# Patient Record
Sex: Male | Born: 1970 | Race: White | Hispanic: No | Marital: Single | Smoking: Former smoker
Health system: Southern US, Community
[De-identification: ages and names within clinical notes are randomized; demographics above are authoritative.]

## PROBLEM LIST (undated history)

## (undated) DIAGNOSIS — I10 Essential (primary) hypertension: Secondary | ICD-10-CM

---

## 2020-03-25 ENCOUNTER — Emergency Department (HOSPITAL_COMMUNITY)

## 2020-03-25 ENCOUNTER — Encounter (HOSPITAL_COMMUNITY): Payer: Self-pay

## 2020-03-25 ENCOUNTER — Other Ambulatory Visit: Payer: Self-pay

## 2020-03-25 ENCOUNTER — Emergency Department (HOSPITAL_COMMUNITY)
Admission: EM | Admit: 2020-03-25 | Discharge: 2020-03-25 | Disposition: A | Discharge status: Home / Self Care | Attending: Emergency Medicine | Admitting: Emergency Medicine

## 2020-03-25 DIAGNOSIS — R079 Chest pain, unspecified: Secondary | ICD-10-CM | POA: Insufficient documentation

## 2020-03-25 DIAGNOSIS — I10 Essential (primary) hypertension: Secondary | ICD-10-CM | POA: Insufficient documentation

## 2020-03-25 DIAGNOSIS — Z87891 Personal history of nicotine dependence: Secondary | ICD-10-CM | POA: Diagnosis not present

## 2020-03-25 HISTORY — DX: Essential (primary) hypertension: I10

## 2020-03-25 LAB — CBC
HCT: 44.8 % (ref 39.0–52.0)
Hemoglobin: 15.1 g/dL (ref 13.0–17.0)
MCH: 30.6 pg (ref 26.0–34.0)
MCHC: 33.7 g/dL (ref 30.0–36.0)
MCV: 90.7 fL (ref 80.0–100.0)
Platelets: 259 10*3/uL (ref 150–400)
RBC: 4.94 MIL/uL (ref 4.22–5.81)
RDW: 12.7 % (ref 11.5–15.5)
WBC: 7.6 10*3/uL (ref 4.0–10.5)
nRBC: 0 % (ref 0.0–0.2)

## 2020-03-25 LAB — BASIC METABOLIC PANEL
Anion gap: 9 (ref 5–15)
BUN: 8 mg/dL (ref 6–20)
CO2: 26 mmol/L (ref 22–32)
Calcium: 9.3 mg/dL (ref 8.9–10.3)
Chloride: 104 mmol/L (ref 98–111)
Creatinine, Ser: 0.84 mg/dL (ref 0.61–1.24)
GFR calc Af Amer: >60 mL/min (ref >60)
GFR calc non Af Amer: >60 mL/min (ref >60)
Glucose, Bld: 132 mg/dL — ABNORMAL HIGH (ref 70–99)
Potassium: 3.5 mmol/L (ref 3.5–5.1)
Sodium: 139 mmol/L (ref 135–145)

## 2020-03-25 LAB — TROPONIN I (HIGH SENSITIVITY)
Troponin I (High Sensitivity): 3 ng/L (ref <18)
Troponin I (High Sensitivity): 3 ng/L (ref <18)

## 2020-03-25 NOTE — ED Triage Notes (Signed)
Pt from [facility] . Pt reports left chest area and left shoulder since 1630. Also BP elevated 180/120. Also reports SOB

## 2020-03-25 NOTE — Discharge Instructions (Signed)
Your tests are normal, there is no signs of heart attack.  You do have elevated blood pressure and you should take an increase amount of your blood pressure medication.  Please talk to your facility health care provider about increasing her dose of blood pressure medication.  You should also have them pursue a stress test.  Talk to them in the morning about this.  If you have worsening symptoms return to the emergency department.

## 2020-03-25 NOTE — ED Notes (Signed)
Report called to prison triage.  [phone] [place]

## 2020-03-25 NOTE — ED Provider Notes (Signed)
[facility] EMERGENCY DEPARTMENT Provider Note   CSN: [id number] Arrival date & time: 03/25/20  1821     History Chief Complaint  Patient presents with  . Chest Pain    Erik Cooper is a 49 y.o. male.  HPI   This patient is a 49 year old male, recently started taking treatment for hypertension, he also recently started taking a diuretic because of a problem with some swelling in his legs.  This started about 3 weeks ago.  He is a former smoker, he is currently incarcerated and has been incarcerated for over 20 years.  He is coming up on about 6 months prior to his release and feels like he is becoming more more anxious about this.  The patient notes that earlier today he developed some discomfort on the left side of his chest, this was felt like a pressure or a heaviness in that area.  There was some radiation into the left arm, he states that he gets better when he raises the left arm above his head worse when he lies it is laid down beside him.  It is not worse with shortness of breath, he denies having any swelling of the legs today.  He reports that he usually walks continually throughout the afternoon multiple hours per day and never has any chest pain with walking.  The patient has not had any coughing, any fevers or any other systemic symptoms.  He has never had known coronary disease or arrhythmia.  Past Medical History:  Diagnosis Date  . Hypertension     There are no problems to display for this patient.   History reviewed. No pertinent surgical history.     No family history on file.  Social History   Tobacco Use  . Smoking status: Former Smoker  Substance Use Topics  . Alcohol use: Not Currently  . Drug use: Not Currently    Home Medications Prior to Admission medications   Not on File    Allergies    Patient has no known allergies.  Review of Systems   Review of Systems  All other systems reviewed and are negative.   Physical Exam Updated Vital  Signs BP (!) 172/113   Pulse 80   Temp 99.3 F (37.4 C) (Oral)   Resp 18   Ht 1.702 m (5\' 7" )   Wt 88.1 kg   SpO2 99%   BMI 30.43 kg/m   Physical Exam Vitals and nursing note reviewed.  Constitutional:      General: He is not in acute distress.    Appearance: He is well-developed.  HENT:     Head: Normocephalic and atraumatic.     Mouth/Throat:     Pharynx: No oropharyngeal exudate.  Eyes:     General: No scleral icterus.       Right eye: No discharge.        Left eye: No discharge.     Conjunctiva/sclera: Conjunctivae normal.     Pupils: Pupils are equal, round, and reactive to light.  Neck:     Thyroid: No thyromegaly.     Vascular: No JVD.  Cardiovascular:     Rate and Rhythm: Normal rate and regular rhythm.     Heart sounds: Normal heart sounds. No murmur heard.  No friction rub. No gallop.   Pulmonary:     Effort: Pulmonary effort is normal. No respiratory distress.     Breath sounds: Normal breath sounds. No wheezing or rales.  Abdominal:  General: Bowel sounds are normal. There is no distension.     Palpations: Abdomen is soft. There is no mass.     Tenderness: There is no abdominal tenderness.  Musculoskeletal:        General: No tenderness. Normal range of motion.     Cervical back: Normal range of motion and neck supple.  Lymphadenopathy:     Cervical: No cervical adenopathy.  Skin:    General: Skin is warm and dry.     Findings: No erythema or rash.  Neurological:     Mental Status: He is alert.     Coordination: Coordination normal.  Psychiatric:        Behavior: Behavior normal.     ED Results / Procedures / Treatments   Labs (all labs ordered are listed, but only abnormal results are displayed) Labs Reviewed  BASIC METABOLIC PANEL - Abnormal; Notable for the following components:      Result Value   Glucose, Bld 132 (*)    All other components within normal limits  CBC  TROPONIN I (HIGH SENSITIVITY)  TROPONIN I (HIGH SENSITIVITY)     EKG EKG Interpretation  Date/Time:  Saturday March 25 2020 18:45:20 EDT Ventricular Rate:  87 PR Interval:    QRS Duration: 98 QT Interval:  323 QTC Calculation: 389 R Axis:   119 Text Interpretation: Sinus rhythm Left posterior fascicular block Borderline abnrm T, anterolateral leads No old tracing to compare Confirmed by Eber Hong ([id number]) on 03/25/2020 6:53:47 PM   Radiology DG Chest 2 View  Result Date: 03/25/2020 CLINICAL DATA:  Short of breath, left chest pain radiating to left arm EXAM: CHEST - 2 VIEW COMPARISON:  None FINDINGS: Frontal and lateral views of the chest demonstrate an unremarkable cardiac silhouette. No airspace disease, effusion, or pneumothorax. Chronic appearing L1 compression fracture is noted, with greater than 75% loss of height. No other acute bony abnormalities. IMPRESSION: 1. No acute intrathoracic process. 2. Chronic appearing L1 compression deformity. Electronically Signed   By: Sharlet Salina M.D.   On: 03/25/2020 19:18    Procedures Procedures (including critical care time)  Medications Ordered in ED Medications - No data to display  ED Course  I have reviewed the triage vital signs and the nursing notes.  Pertinent labs & imaging results that were available during my care of the patient were reviewed by me and considered in my medical decision making (see chart for details).    MDM Rules/Calculators/A&P                          This patient has no acute findings on his exam, he has no acute findings on the EKG, his troponin is normal, his vital signs show hypertension with a blood pressure of 162/97.  He has had chest pain since 4:00 in the afternoon, it has been going on for 4 hours, he will need a delta troponin but I suspect this is noncardiac given the improvement with raising his arm and the lack of symptoms when he exerts himself.  The patient is agreeable to the plan.  Well-appearing at this time, 2 - troponins, the patient is well,  stable for discharge, hypertension present but can be treated as an outpatient.  Final Clinical Impression(s) / ED Diagnoses Final diagnoses:  Chest pain at rest  Essential hypertension      Eber Hong, MD 03/25/20 2230

## 2020-06-17 ENCOUNTER — Emergency Department (HOSPITAL_COMMUNITY)
Admission: EM | Admit: 2020-06-17 | Discharge: 2020-06-17 | Disposition: A | Discharge status: Home / Self Care | Attending: Emergency Medicine | Admitting: Emergency Medicine

## 2020-06-17 ENCOUNTER — Emergency Department (HOSPITAL_COMMUNITY)

## 2020-06-17 ENCOUNTER — Other Ambulatory Visit: Payer: Self-pay

## 2020-06-17 ENCOUNTER — Encounter (HOSPITAL_COMMUNITY): Payer: Self-pay | Admitting: Emergency Medicine

## 2020-06-17 DIAGNOSIS — M722 Plantar fascial fibromatosis: Secondary | ICD-10-CM | POA: Diagnosis not present

## 2020-06-17 DIAGNOSIS — I10 Essential (primary) hypertension: Secondary | ICD-10-CM | POA: Diagnosis not present

## 2020-06-17 DIAGNOSIS — R079 Chest pain, unspecified: Secondary | ICD-10-CM | POA: Diagnosis present

## 2020-06-17 DIAGNOSIS — Z87891 Personal history of nicotine dependence: Secondary | ICD-10-CM | POA: Insufficient documentation

## 2020-06-17 DIAGNOSIS — R0789 Other chest pain: Secondary | ICD-10-CM | POA: Diagnosis not present

## 2020-06-17 LAB — BASIC METABOLIC PANEL
Anion gap: 12 (ref 5–15)
BUN: 8 mg/dL (ref 6–20)
CO2: 25 mmol/L (ref 22–32)
Calcium: 8.8 mg/dL — ABNORMAL LOW (ref 8.9–10.3)
Chloride: 102 mmol/L (ref 98–111)
Creatinine, Ser: 0.73 mg/dL (ref 0.61–1.24)
GFR, Estimated: >60 mL/min (ref >60)
Glucose, Bld: 181 mg/dL — ABNORMAL HIGH (ref 70–99)
Potassium: 3.8 mmol/L (ref 3.5–5.1)
Sodium: 139 mmol/L (ref 135–145)

## 2020-06-17 LAB — CBC
HCT: 43 % (ref 39.0–52.0)
Hemoglobin: 14.3 g/dL (ref 13.0–17.0)
MCH: 30.5 pg (ref 26.0–34.0)
MCHC: 33.3 g/dL (ref 30.0–36.0)
MCV: 91.7 fL (ref 80.0–100.0)
Platelets: 261 10*3/uL (ref 150–400)
RBC: 4.69 MIL/uL (ref 4.22–5.81)
RDW: 12.9 % (ref 11.5–15.5)
WBC: 8.8 10*3/uL (ref 4.0–10.5)
nRBC: 0 % (ref 0.0–0.2)

## 2020-06-17 LAB — TROPONIN I (HIGH SENSITIVITY)
Troponin I (High Sensitivity): 3 ng/L (ref <18)
Troponin I (High Sensitivity): 3 ng/L (ref <18)

## 2020-06-17 MED ORDER — IBUPROFEN 400 MG PO TABS
600.0000 mg | ORAL_TABLET | Freq: Once | ORAL | Status: AC
  Administered 2020-06-17: 600 mg via ORAL
  Filled 2020-06-17: qty 2

## 2020-06-17 MED ORDER — METHOCARBAMOL 500 MG PO TABS
ORAL_TABLET | ORAL | 0 refills | Status: AC
Start: 2020-06-17 — End: ?

## 2020-06-17 MED ORDER — METHOCARBAMOL 500 MG PO TABS
500.0000 mg | ORAL_TABLET | Freq: Once | ORAL | Status: AC
  Administered 2020-06-17: 500 mg via ORAL
  Filled 2020-06-17: qty 1

## 2020-06-17 MED ORDER — NAPROXEN 500 MG PO TABS
500.0000 mg | ORAL_TABLET | Freq: Two times a day (BID) | ORAL | 0 refills | Status: AC
Start: 2020-06-17 — End: ?

## 2020-06-17 NOTE — Discharge Instructions (Addendum)
Gel heel pads in your shoe can help with the plantar fascitis.  You use ice and heat for comfort if available.  You can take either the naproxen as prescribed or ibuprofen 600 mg 4 times a day for pain.  The Robaxin will also help with your chest wall pain.  Recheck if you get fever, productive cough, or feel short of breath.

## 2020-06-17 NOTE — ED Notes (Signed)
Report given to Dr. Stacie Glaze for prison return. Discharged with police. VS stable. Verbalized understanding of discharge instructions.

## 2020-06-17 NOTE — ED Provider Notes (Signed)
[facility] EMERGENCY DEPARTMENT Provider Note   CSN: [id number] Arrival date & time: 06/17/20  0253   Time seen 5:49 AM  History Chief Complaint  Patient presents with  . Chest Pain    right foot pain    Erik Cooper is a 49 y.o. male.  HPI   Patient states yesterday evening, October 15 around 10 PM he started having left upper chest pain that is constant and described as sharp.  He states it radiates down his left arm to his elbow.  He states some movements make it worse.  Nothing makes it feel better.  He has had a dry cough for couple of days and had rhinorrhea after he took a shower but that is gone.  He denies sore throat but states he has nausea without vomiting and he feels short of breath and has a lot of shortness of breath if he lays flat.  He denies diarrhea.  He also states that heel of his right foot has been hurting for a couple days and he has difficulty walking because it hurts to put pressure on it.  He states he has to walk on the ball of his foot.  He denies any family history of coronary artery disease.  He states he does not smoke and he has had high blood pressure but has never taken medications for it, he denies history of diabetes or high cholesterol.  PCP Patient, No Pcp Per]  Past Medical History:  Diagnosis Date  . Hypertension     There are no problems to display for this patient.   History reviewed. No pertinent surgical history.     History reviewed. No pertinent family history.  Social History   Tobacco Use  . Smoking status: Former Smoker    Types: Cigarettes    Quit date: 06/17/2008    Years since quitting: 12.0  . Smokeless tobacco: Never Used  Substance Use Topics  . Alcohol use: Not Currently  . Drug use: Not Currently  Patient is currently incarcerated  Home Medications Prior to Admission medications   Medication Sig Start Date End Date Taking? Authorizing Provider  methocarbamol (ROBAXIN) 500 MG tablet Take 1 or 2 po Q  6hrs for muscle pain 06/17/20   Devoria Albe, MD  naproxen (NAPROSYN) 500 MG tablet Take 1 tablet (500 mg total) by mouth 2 (two) times daily with a meal. 06/17/20   Devoria Albe, MD  Patient states he takes no medications  Allergies    Patient has no known allergies.  Review of Systems   Review of Systems  All other systems reviewed and are negative.   Physical Exam Updated Vital Signs BP (!) 167/104   Pulse 85   Temp 97.9 F (36.6 C) (Oral)   Resp (!) 25   Ht 5\' 7"  (1.702 m)   Wt 81.6 kg   SpO2 97%   BMI 28.19 kg/m   Physical Exam Vitals and nursing note reviewed.  Constitutional:      General: He is not in acute distress.    Appearance: Normal appearance. He is obese. He is not toxic-appearing.  HENT:     Head: Normocephalic and atraumatic.     Right Ear: External ear normal.     Left Ear: External ear normal.  Eyes:     Extraocular Movements: Extraocular movements intact.     Conjunctiva/sclera: Conjunctivae normal.     Pupils: Pupils are equal, round, and reactive to light.  Cardiovascular:     Rate  and Rhythm: Normal rate and regular rhythm.     Pulses: Normal pulses.     Heart sounds: Normal heart sounds. No murmur heard.  No gallop.   Pulmonary:     Effort: Pulmonary effort is normal. No respiratory distress.     Breath sounds: Normal breath sounds.  Chest:     Chest wall: Tenderness present.       Comments: Area of pain noted, he is also tender to palpation of both the right and left costochondral junctions. Musculoskeletal:        General: Tenderness present. No swelling or deformity.     Cervical back: Normal range of motion.     Comments: Patient has tenderness in the center of his right heel consistent with plantar fasciitis.  There is no tenderness to the top of his foot, ankle, lower extremity, calf, or knee.  There is no knee effusion noted.  He has good distal pulses and capillary refill.  Skin:    General: Skin is warm and dry.     Findings: No  erythema or rash.  Neurological:     General: No focal deficit present.     Mental Status: He is alert and oriented to person, place, and time.     Cranial Nerves: No cranial nerve deficit.  Psychiatric:        Mood and Affect: Mood normal.        Behavior: Behavior normal.        Thought Content: Thought content normal.     ED Results / Procedures / Treatments   Labs (all labs ordered are listed, but only abnormal results are displayed) Results for orders placed or performed during the hospital encounter of 06/17/20  CBC  Result Value Ref Range   WBC 8.8 4.0 - 10.5 K/uL   RBC 4.69 4.22 - 5.81 MIL/uL   Hemoglobin 14.3 13.0 - 17.0 g/dL   HCT 40.9 39 - 52 %   MCV 91.7 80.0 - 100.0 fL   MCH 30.5 26.0 - 34.0 pg   MCHC 33.3 30.0 - 36.0 g/dL   RDW 81.1 91.4 - 78.2 %   Platelets 261 150 - 400 K/uL   nRBC 0.0 0.0 - 0.2 %  Basic metabolic panel  Result Value Ref Range   Sodium 139 135 - 145 mmol/L   Potassium 3.8 3.5 - 5.1 mmol/L   Chloride 102 98 - 111 mmol/L   CO2 25 22 - 32 mmol/L   Glucose, Bld 181 (H) 70 - 99 mg/dL   BUN 8 6 - 20 mg/dL   Creatinine, Ser 9.56 0.61 - 1.24 mg/dL   Calcium 8.8 (L) 8.9 - 10.3 mg/dL   GFR, Estimated >21 >30 mL/min   Anion gap 12 5 - 15  Troponin I (High Sensitivity)  Result Value Ref Range   Troponin I (High Sensitivity) 3 <18 ng/L   Laboratory interpretation all normal except nonfasting hyperglycemia    EKG EKG Interpretation  Date/Time:  Saturday June 17 2020 03:09:52 EDT Ventricular Rate:  85 PR Interval:  154 QRS Duration: 86 QT Interval:  388 QTC Calculation: 461 R Axis:   104 Text Interpretation: Normal sinus rhythm Rightward axis Nonspecific T wave abnormality Prolonged QT No significant change since last tracing 25 Mar 2020 Confirmed by Devoria Albe ([id number]) on 06/17/2020 4:10:10 AM   Radiology DG Chest 1 View  Result Date: 06/17/2020 CLINICAL DATA:  Chest pain EXAM: CHEST  1 VIEW COMPARISON:  03/25/2020 FINDINGS: The  heart size  and mediastinal contours are within normal limits. Both lungs are clear. The visualized skeletal structures are unremarkable. IMPRESSION: No active disease. Electronically Signed   By: Deatra Robinson M.D.   On: 06/17/2020 04:24   DG Foot Complete Right  Result Date: 06/17/2020 CLINICAL DATA:  Right foot pain EXAM: RIGHT FOOT COMPLETE - 3+ VIEW COMPARISON:  None. FINDINGS: There is no evidence of fracture or dislocation. There is no evidence of arthropathy or other focal bone abnormality. Soft tissues are unremarkable. IMPRESSION: Negative. Electronically Signed   By: Deatra Robinson M.D.   On: 06/17/2020 04:23    Procedures Procedures (including critical care time)  Medications Ordered in ED Medications  ibuprofen (ADVIL) tablet 600 mg (600 mg Oral Given 06/17/20 0629)  methocarbamol (ROBAXIN) tablet 500 mg (500 mg Oral Given 06/17/20 1540)    ED Course  I have reviewed the triage vital signs and the nursing notes.  Pertinent labs & imaging results that were available during my care of the patient were reviewed by me and considered in my medical decision making (see chart for details).    MDM Rules/Calculators/A&P                          We discussed his x-ray results which were normal and his EKG.  His laboratory tests were pending.  While waiting for those he was given ibuprofen and Robaxin.  We discussed if they would allow it in the prison that he could have gel heel pads in his shoes for the plantar fasciitis.  Recheck at 7:00 AM patient states his pain is improving with the ibuprofen and Robaxin.  We discussed his initial troponin was normal.  He should be having his delta troponin drawn soon around 7:30 AM.  If it is normal he will be sent back to his prison.  I suspect it will be normal because he has had the pain for several hours before his initial troponin was drawn.  He also states his pain is improved with the anti-inflammatory muscle relaxer so it makes it even more  likely is going to be musculoskeletal in origin.  Patient left with Dr. Linwood Dibbles at change of shift to get the results of his delta troponin.   Final Clinical Impression(s) / ED Diagnoses Final diagnoses:  Plantar fasciitis of right foot  Chest wall pain    Rx / DC Orders ED Discharge Orders         Ordered    naproxen (NAPROSYN) 500 MG tablet  2 times daily with meals        06/17/20 0731    methocarbamol (ROBAXIN) 500 MG tablet        06/17/20 0731         Disposition pending delta troponin  Devoria Albe, MD, Concha Pyo, MD 06/17/20 [phone]

## 2020-06-17 NOTE — ED Triage Notes (Signed)
Patient complains of chest pain and right foot pain that both started last night.

## 2020-06-17 NOTE — ED Provider Notes (Signed)
Pt initially seen by Dr Devoria Albe.  Please see her note.  Delta troponin negative.  Plan on discharge.   Linwood Dibbles, MD 06/17/20 [phone]

## 2021-02-21 IMAGING — DX DG FOOT COMPLETE 3+V*R*
3 series · 3 of 3 positions shown · non-contrast
Comparison: None.

CLINICAL DATA: Right foot pain

EXAM:
RIGHT FOOT COMPLETE - 3+ VIEW

[foot ap]
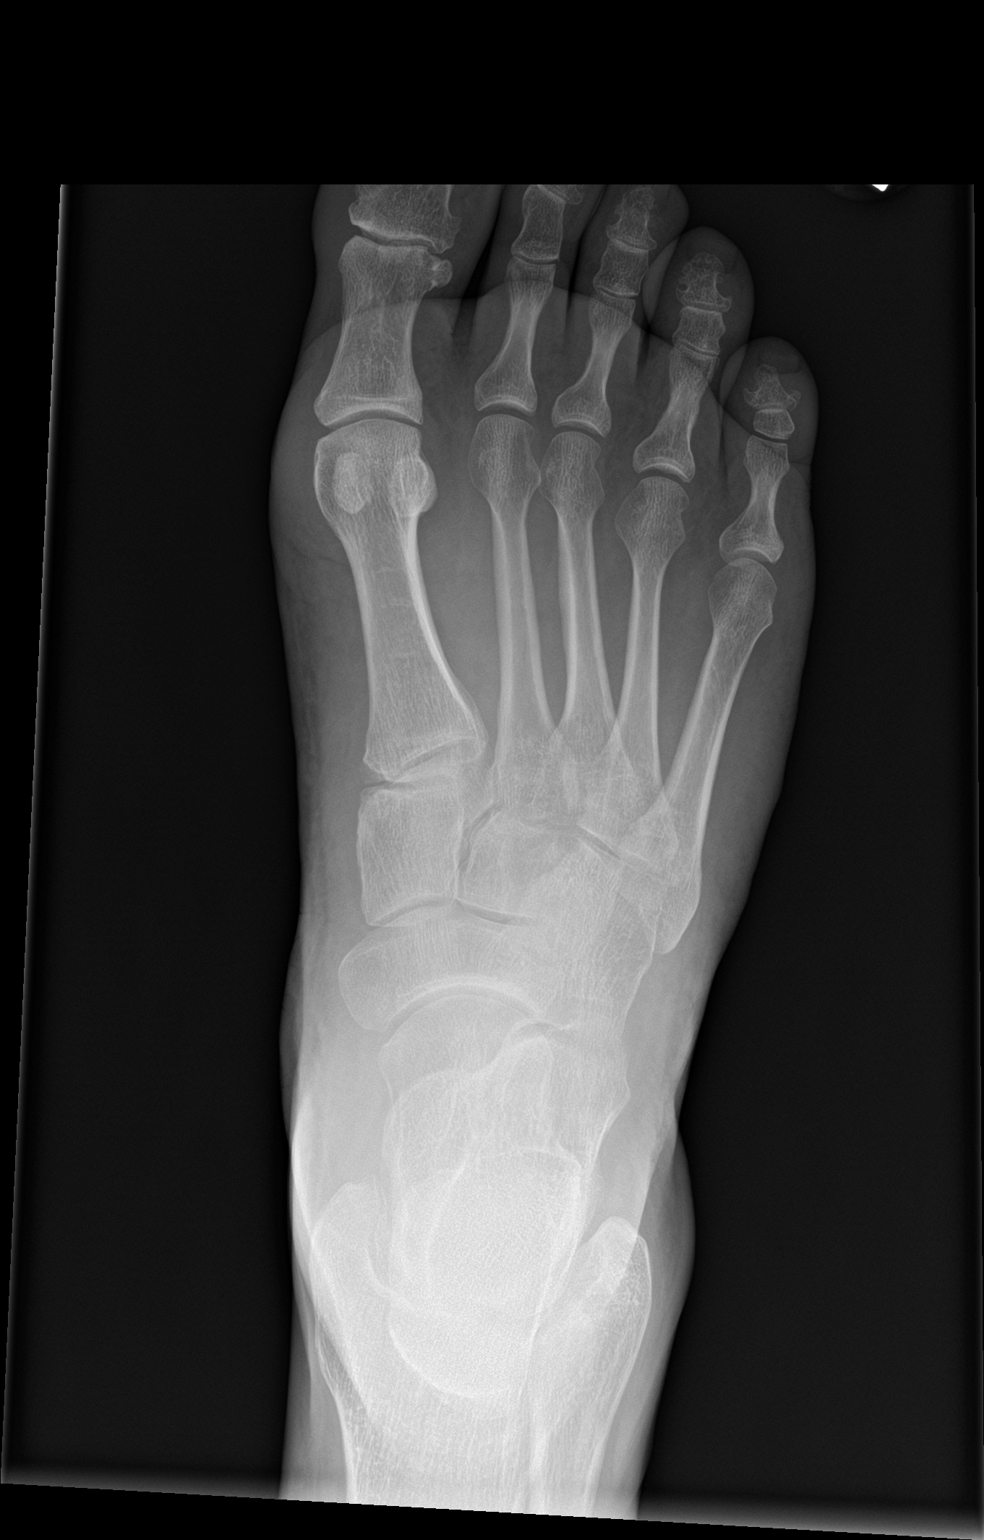

[foot obl]
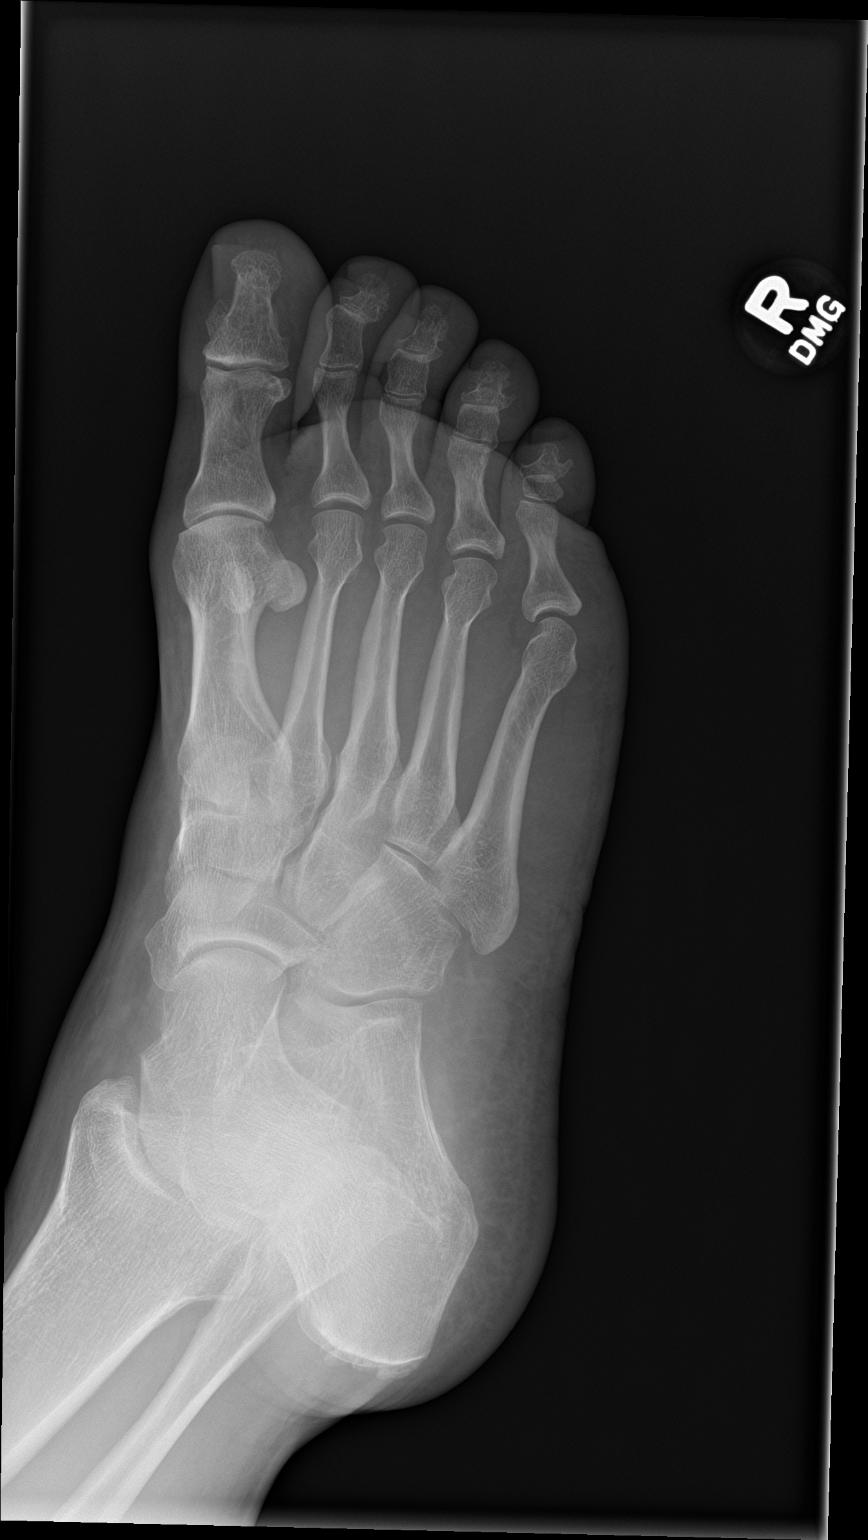

[foot lat]
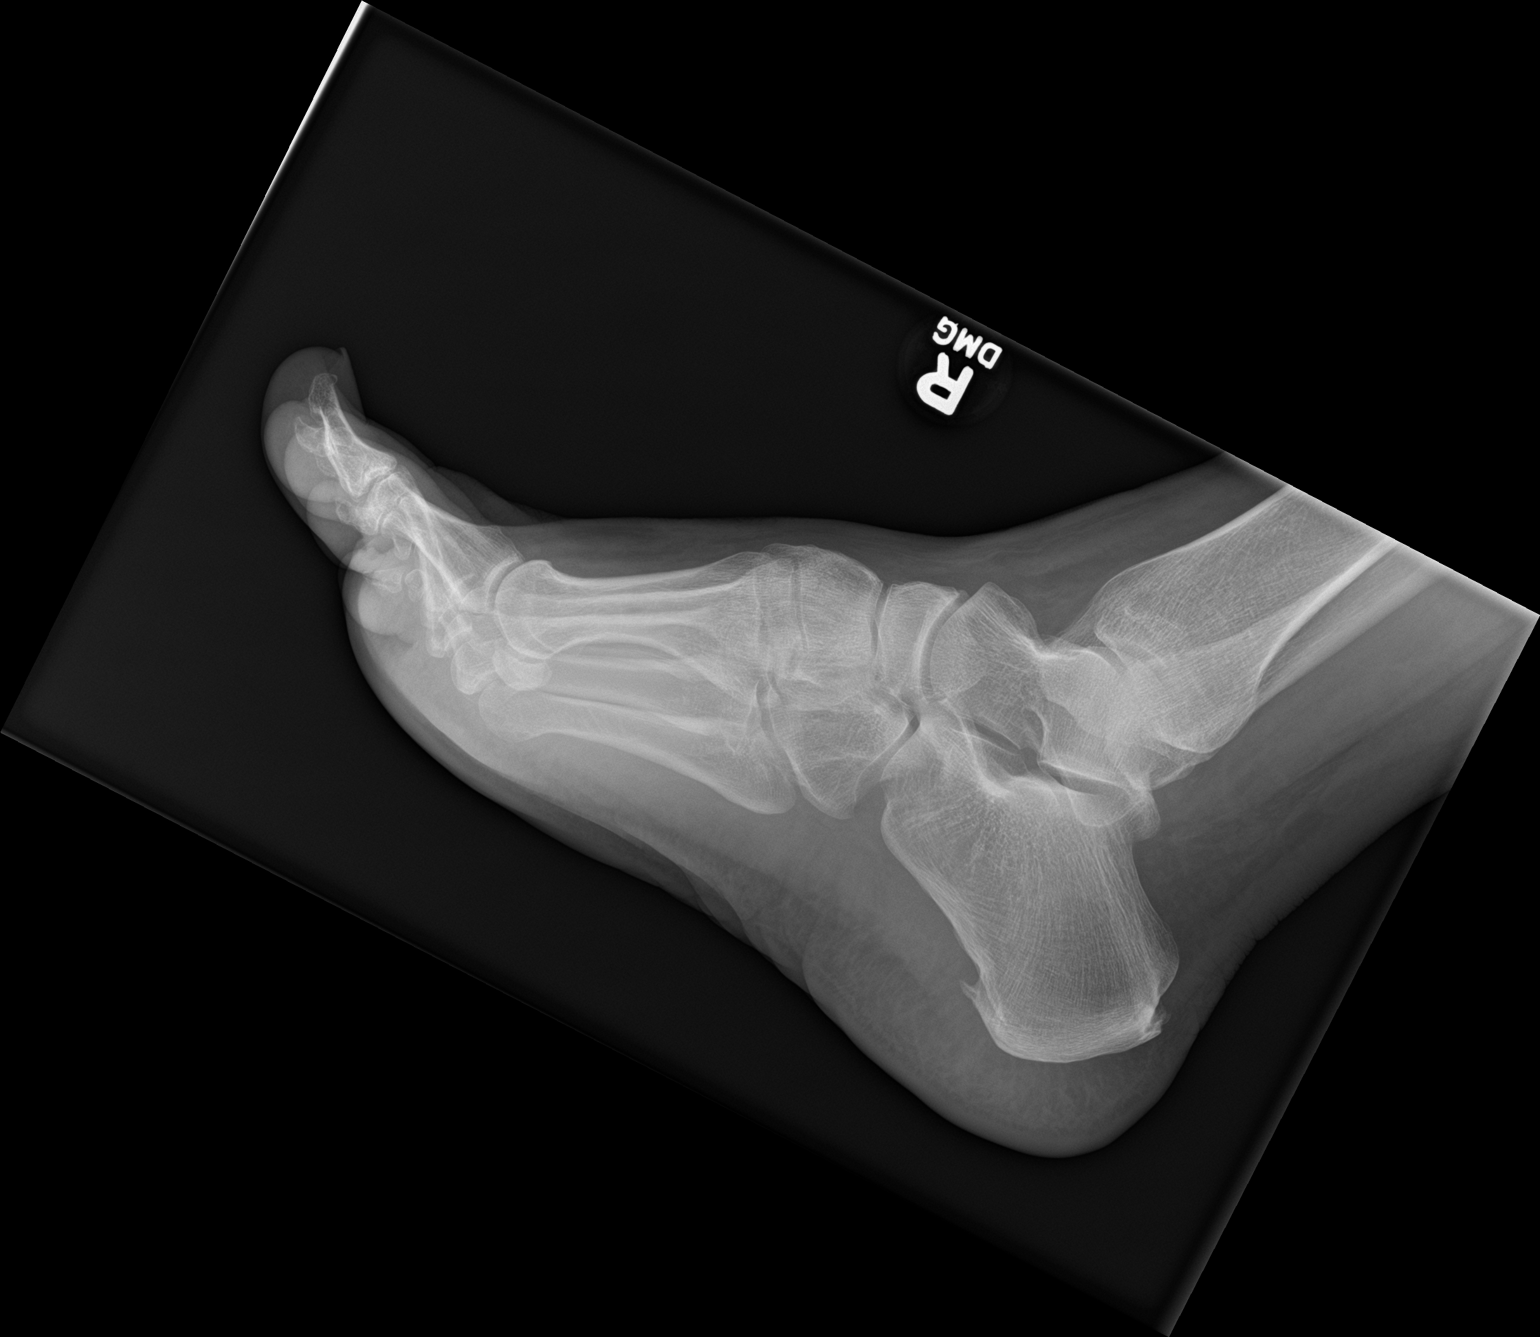

[3 of 3 positions shown; findings below may reference images not displayed]

FINDINGS: There is no evidence of fracture or dislocation. There is no
evidence of arthropathy or other focal bone abnormality. Soft
tissues are unremarkable.
IMPRESSION: Negative.
